# Patient Record
Sex: Female | Born: 2005 | Race: Black or African American | Hispanic: No | Marital: Single | Smoking: Never smoker
Health system: Southern US, Community
[De-identification: ages and names within clinical notes are randomized; demographics above are authoritative.]

## PROBLEM LIST (undated history)

## (undated) HISTORY — PX: NO PAST SURGERIES: SHX2092

---
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
## 2024-01-24 DIAGNOSIS — D508 Other iron deficiency anemias: Secondary | ICD-10-CM | POA: Diagnosis not present

## 2024-02-03 DIAGNOSIS — H5213 Myopia, bilateral: Secondary | ICD-10-CM | POA: Diagnosis not present
